# Patient Record
Sex: Female | Born: 1995
Health system: Southern US, Community
[De-identification: ages and names within clinical notes are randomized; demographics above are authoritative.]

---
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
## 2016-08-09 DIAGNOSIS — K529 Noninfective gastroenteritis and colitis, unspecified: Secondary | ICD-10-CM | POA: Diagnosis not present

## 2016-12-15 DIAGNOSIS — L700 Acne vulgaris: Secondary | ICD-10-CM | POA: Diagnosis not present

## 2017-02-09 DIAGNOSIS — L700 Acne vulgaris: Secondary | ICD-10-CM | POA: Diagnosis not present

## 2017-03-17 DIAGNOSIS — Z01419 Encounter for gynecological examination (general) (routine) without abnormal findings: Secondary | ICD-10-CM | POA: Diagnosis not present

## 2017-03-17 DIAGNOSIS — R309 Painful micturition, unspecified: Secondary | ICD-10-CM | POA: Diagnosis not present

## 2017-03-17 DIAGNOSIS — N390 Urinary tract infection, site not specified: Secondary | ICD-10-CM | POA: Diagnosis not present

## 2017-03-17 DIAGNOSIS — Z681 Body mass index (BMI) 19 or less, adult: Secondary | ICD-10-CM | POA: Diagnosis not present

## 2017-03-17 DIAGNOSIS — Z113 Encounter for screening for infections with a predominantly sexual mode of transmission: Secondary | ICD-10-CM | POA: Diagnosis not present

## 2017-04-24 DIAGNOSIS — N3000 Acute cystitis without hematuria: Secondary | ICD-10-CM | POA: Diagnosis not present

## 2017-04-24 DIAGNOSIS — R102 Pelvic and perineal pain: Secondary | ICD-10-CM | POA: Diagnosis not present

## 2017-04-24 DIAGNOSIS — N390 Urinary tract infection, site not specified: Secondary | ICD-10-CM | POA: Diagnosis not present

## 2017-07-15 DIAGNOSIS — R300 Dysuria: Secondary | ICD-10-CM | POA: Diagnosis not present

## 2017-07-15 DIAGNOSIS — J029 Acute pharyngitis, unspecified: Secondary | ICD-10-CM | POA: Diagnosis not present

## 2017-07-15 DIAGNOSIS — N390 Urinary tract infection, site not specified: Secondary | ICD-10-CM | POA: Diagnosis not present

## 2017-08-16 DIAGNOSIS — L700 Acne vulgaris: Secondary | ICD-10-CM | POA: Diagnosis not present

## 2017-09-06 DIAGNOSIS — R3915 Urgency of urination: Secondary | ICD-10-CM | POA: Diagnosis not present

## 2018-01-24 DIAGNOSIS — H5213 Myopia, bilateral: Secondary | ICD-10-CM | POA: Diagnosis not present

## 2018-02-21 DIAGNOSIS — L700 Acne vulgaris: Secondary | ICD-10-CM | POA: Diagnosis not present

## 2018-08-14 DIAGNOSIS — Z01419 Encounter for gynecological examination (general) (routine) without abnormal findings: Secondary | ICD-10-CM | POA: Diagnosis not present

## 2018-08-14 DIAGNOSIS — Z681 Body mass index (BMI) 19 or less, adult: Secondary | ICD-10-CM | POA: Diagnosis not present

## 2018-08-19 DIAGNOSIS — F4010 Social phobia, unspecified: Secondary | ICD-10-CM | POA: Diagnosis not present

## 2019-02-21 DIAGNOSIS — L700 Acne vulgaris: Secondary | ICD-10-CM | POA: Diagnosis not present

## 2019-02-22 DIAGNOSIS — Z79899 Other long term (current) drug therapy: Secondary | ICD-10-CM | POA: Diagnosis not present

## 2019-02-22 DIAGNOSIS — L700 Acne vulgaris: Secondary | ICD-10-CM | POA: Diagnosis not present

## 2019-04-24 ENCOUNTER — Other Ambulatory Visit: Payer: Self-pay

## 2019-04-24 ENCOUNTER — Emergency Department
Admission: EM | Admit: 2019-04-24 | Discharge: 2019-04-24 | Disposition: A | Discharge status: Home / Self Care | Payer: BLUE CROSS/BLUE SHIELD | Attending: Emergency Medicine | Admitting: Emergency Medicine

## 2019-04-24 ENCOUNTER — Emergency Department: Payer: BLUE CROSS/BLUE SHIELD

## 2019-04-24 ENCOUNTER — Encounter: Payer: Self-pay | Admitting: Emergency Medicine

## 2019-04-24 DIAGNOSIS — Z79899 Other long term (current) drug therapy: Secondary | ICD-10-CM | POA: Insufficient documentation

## 2019-04-24 DIAGNOSIS — S3991XA Unspecified injury of abdomen, initial encounter: Secondary | ICD-10-CM

## 2019-04-24 DIAGNOSIS — R109 Unspecified abdominal pain: Secondary | ICD-10-CM | POA: Diagnosis not present

## 2019-04-24 DIAGNOSIS — Y92410 Unspecified street and highway as the place of occurrence of the external cause: Secondary | ICD-10-CM | POA: Insufficient documentation

## 2019-04-24 DIAGNOSIS — S3992XA Unspecified injury of lower back, initial encounter: Secondary | ICD-10-CM | POA: Diagnosis not present

## 2019-04-24 DIAGNOSIS — S39012A Strain of muscle, fascia and tendon of lower back, initial encounter: Secondary | ICD-10-CM | POA: Diagnosis not present

## 2019-04-24 DIAGNOSIS — Y998 Other external cause status: Secondary | ICD-10-CM | POA: Insufficient documentation

## 2019-04-24 DIAGNOSIS — Y9389 Activity, other specified: Secondary | ICD-10-CM | POA: Diagnosis not present

## 2019-04-24 DIAGNOSIS — M549 Dorsalgia, unspecified: Secondary | ICD-10-CM | POA: Diagnosis not present

## 2019-04-24 LAB — URINALYSIS, COMPLETE (UACMP) WITH MICROSCOPIC
Bilirubin Urine: NEGATIVE
Glucose, UA: NEGATIVE mg/dL
Ketones, ur: NEGATIVE mg/dL
Leukocytes,Ua: NEGATIVE
Nitrite: NEGATIVE
Protein, ur: 100 mg/dL — AB
Specific Gravity, Urine: 1.012 (ref 1.005–1.030)
pH: 7 (ref 5.0–8.0)

## 2019-04-24 LAB — CBC WITH DIFFERENTIAL/PLATELET
Abs Immature Granulocytes: 0.09 10*3/uL — ABNORMAL HIGH (ref 0.00–0.07)
Basophils Absolute: 0.1 10*3/uL (ref 0.0–0.1)
Basophils Relative: 0 %
Eosinophils Absolute: 0 10*3/uL (ref 0.0–0.5)
Eosinophils Relative: 0 %
HCT: 43.3 % (ref 36.0–46.0)
Hemoglobin: 14.5 g/dL (ref 12.0–15.0)
Immature Granulocytes: 1 %
Lymphocytes Relative: 6 %
Lymphs Abs: 1.2 10*3/uL (ref 0.7–4.0)
MCH: 31.8 pg (ref 26.0–34.0)
MCHC: 33.5 g/dL (ref 30.0–36.0)
MCV: 95 fL (ref 80.0–100.0)
Monocytes Absolute: 0.9 10*3/uL (ref 0.1–1.0)
Monocytes Relative: 5 %
Neutro Abs: 16.2 10*3/uL — ABNORMAL HIGH (ref 1.7–7.7)
Neutrophils Relative %: 88 %
Platelets: 289 10*3/uL (ref 150–400)
RBC: 4.56 MIL/uL (ref 3.87–5.11)
RDW: 11.9 % (ref 11.5–15.5)
WBC: 18.5 10*3/uL — ABNORMAL HIGH (ref 4.0–10.5)
nRBC: 0 % (ref 0.0–0.2)

## 2019-04-24 LAB — BASIC METABOLIC PANEL
Anion gap: 9 (ref 5–15)
BUN: 13 mg/dL (ref 6–20)
CO2: 24 mmol/L (ref 22–32)
Calcium: 9.5 mg/dL (ref 8.9–10.3)
Chloride: 100 mmol/L (ref 98–111)
Creatinine, Ser: 0.55 mg/dL (ref 0.44–1.00)
GFR calc Af Amer: >60 mL/min (ref >60)
GFR calc non Af Amer: >60 mL/min (ref >60)
Glucose, Bld: 94 mg/dL (ref 70–99)
Potassium: 4 mmol/L (ref 3.5–5.1)
Sodium: 133 mmol/L — ABNORMAL LOW (ref 135–145)

## 2019-04-24 LAB — POCT PREGNANCY, URINE: Preg Test, Ur: NEGATIVE

## 2019-04-24 MED ORDER — MORPHINE SULFATE (PF) 2 MG/ML IV SOLN
1.0000 mg | Freq: Once | INTRAVENOUS | Status: AC
  Administered 2019-04-24: 13:00:00 1 mg via INTRAVENOUS
  Filled 2019-04-24: qty 1

## 2019-04-24 MED ORDER — CYCLOBENZAPRINE HCL 5 MG PO TABS: 5.0000 mg | ORAL_TABLET | Freq: Three times a day (TID) | ORAL | 0 refills | Status: AC | PRN

## 2019-04-24 MED ORDER — IOHEXOL 300 MG/ML  SOLN
75.0000 mL | Freq: Once | INTRAMUSCULAR | Status: AC | PRN
  Administered 2019-04-24: 14:00:00 75 mL via INTRAVENOUS
  Filled 2019-04-24: qty 75

## 2019-04-24 MED ORDER — ONDANSETRON 4 MG PO TBDP: 4.0000 mg | ORAL_TABLET | Freq: Three times a day (TID) | ORAL | 0 refills | Status: AC | PRN

## 2019-04-24 MED ORDER — HYDROCODONE-ACETAMINOPHEN 5-325 MG PO TABS: 1.0000 | ORAL_TABLET | Freq: Three times a day (TID) | ORAL | 0 refills | Status: AC | PRN

## 2019-04-24 MED ORDER — ONDANSETRON HCL 4 MG/2ML IJ SOLN
4.0000 mg | Freq: Once | INTRAMUSCULAR | Status: AC
  Administered 2019-04-24: 4 mg via INTRAVENOUS
  Filled 2019-04-24: qty 2
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
## 2019-04-24 NOTE — ED Notes (Signed)
Urine pregnancy negative, POCT machine not crossing over at this time.

## 2019-04-24 NOTE — Discharge Instructions (Signed)
Your exam, labs, CT, and XR are normal at this time. Take the prescription meds as directed. Follow-up with your provider or go to [facility] for increased belly pain, nausea, vomiting, or bleeding.
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
## 2019-04-24 NOTE — ED Notes (Signed)
Patient given only 1 of morphine at 13:03.  Other 1mg  of morphine was wasted by Loleta Dicker, RN and witnessed by Thereasa Parkin, RN.  Patient could not be found in the pyxis when pyxis waste was attempted.  Pharmacy staff, Barbara Cower, notified of waste.

## 2019-06-13 DIAGNOSIS — Z79899 Other long term (current) drug therapy: Secondary | ICD-10-CM | POA: Diagnosis not present

## 2019-06-13 DIAGNOSIS — L700 Acne vulgaris: Secondary | ICD-10-CM | POA: Diagnosis not present

## 2019-09-23 DIAGNOSIS — L700 Acne vulgaris: Secondary | ICD-10-CM | POA: Diagnosis not present

## 2019-10-03 DIAGNOSIS — Z681 Body mass index (BMI) 19 or less, adult: Secondary | ICD-10-CM | POA: Diagnosis not present

## 2019-10-03 DIAGNOSIS — Z01419 Encounter for gynecological examination (general) (routine) without abnormal findings: Secondary | ICD-10-CM | POA: Diagnosis not present

## 2019-10-11 IMAGING — CR LUMBAR SPINE - COMPLETE 4+ VIEW
1 series · 5 of 5 positions shown · non-contrast
Comparison: CT 04/24/2019

CLINICAL DATA: Trauma, back pain

EXAM:
LUMBAR SPINE - COMPLETE 4+ VIEW

[Series 1: dg lumbar spine complete 4 +v · 0.14mm/px · 5 of 5 slices shown]
[im 1/5]
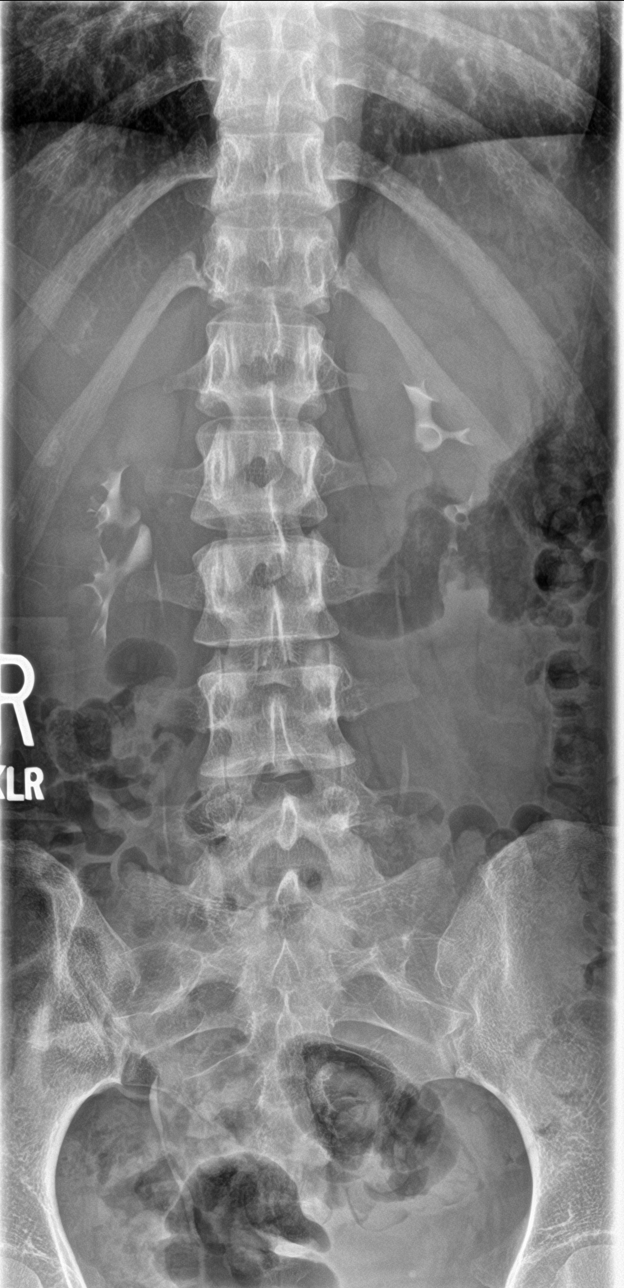
[im 2/5]
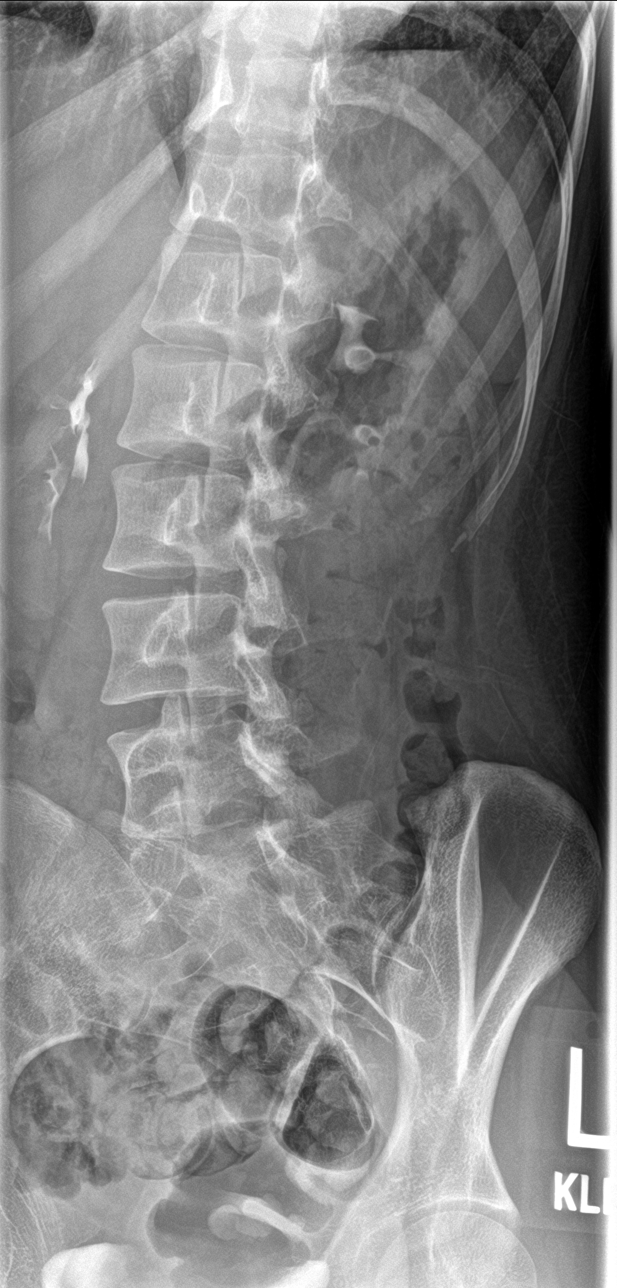
[im 3/5]
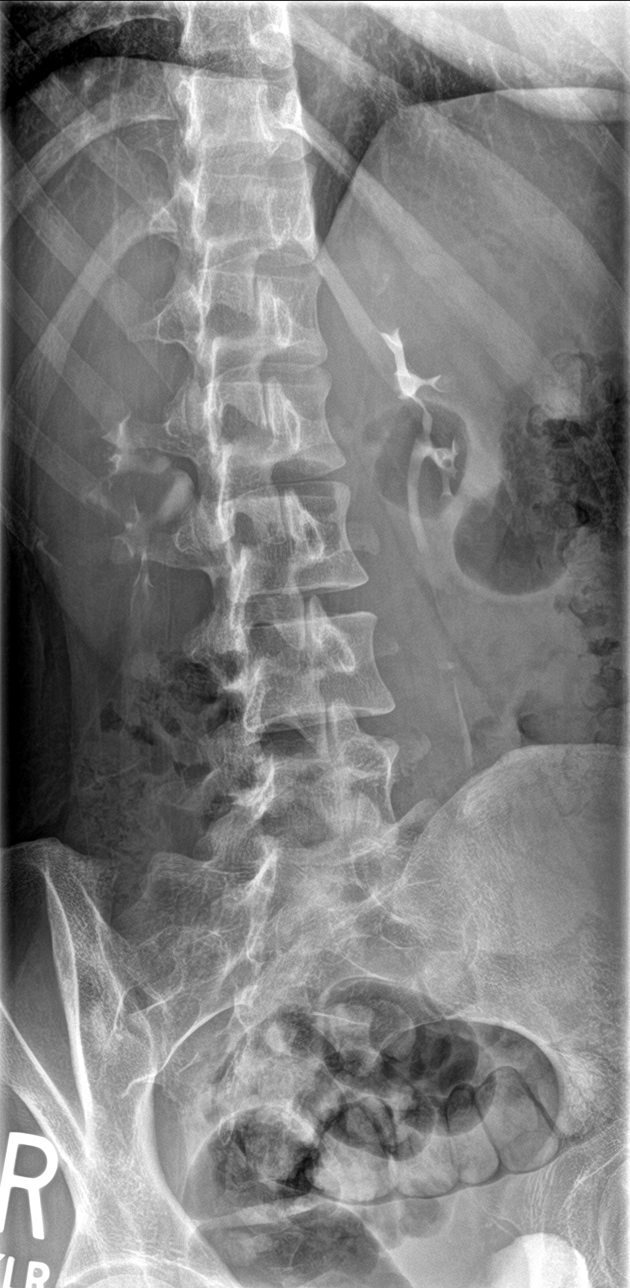
[im 4/5]
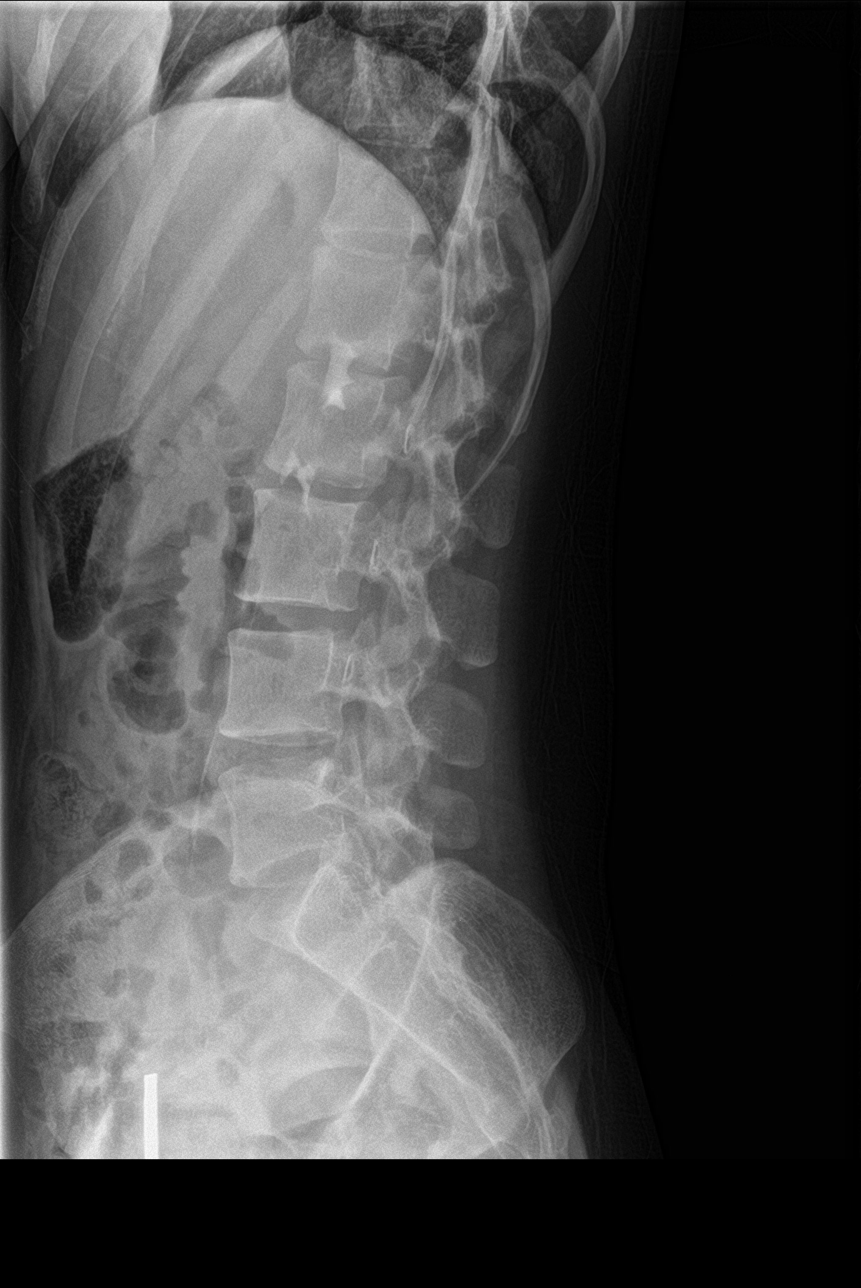
[im 5/5]
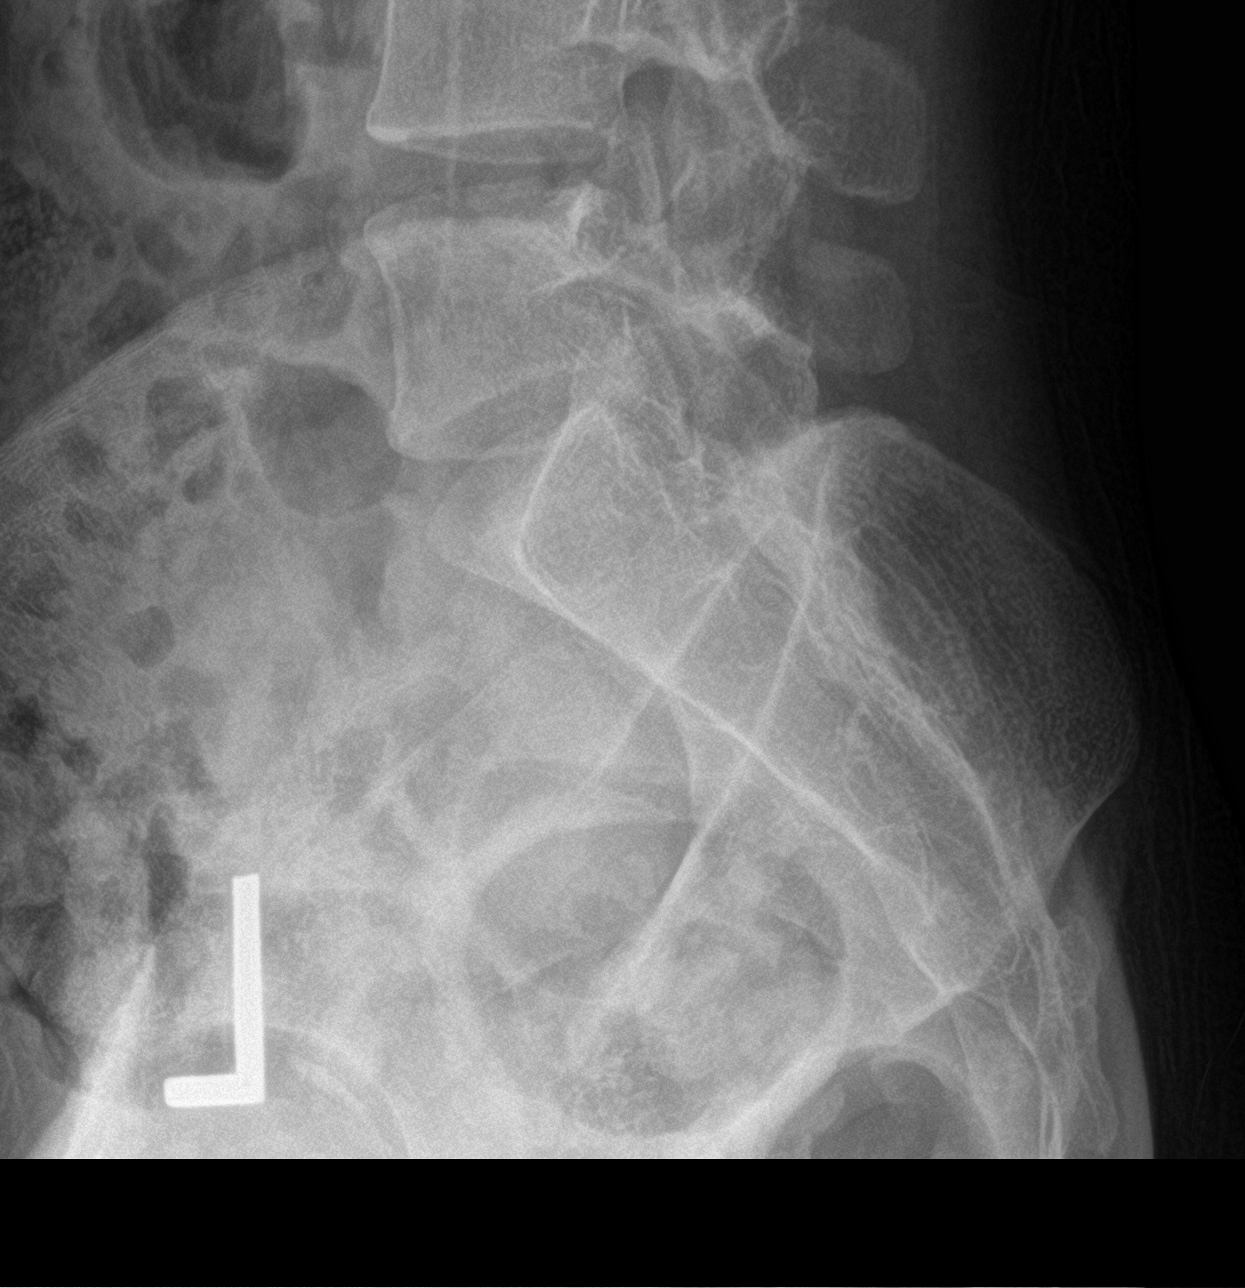

[5 of 5 positions shown; findings below may reference images not displayed]

FINDINGS: Residual contrast within the renal collecting systems. Lumbar
alignment within normal limits. Vertebral body heights appear
normal. The disc spaces are within normal limits.
IMPRESSION: Negative.
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
